# Patient Record
Sex: Female | Born: 1995 | Race: Black or African American | Hispanic: No | Marital: Single | Smoking: Never smoker
Health system: Southern US, Community
[De-identification: ages and names within clinical notes are randomized; demographics above are authoritative.]

## PROBLEM LIST (undated history)

## (undated) DIAGNOSIS — Z87898 Personal history of other specified conditions: Secondary | ICD-10-CM

## (undated) DIAGNOSIS — J45909 Unspecified asthma, uncomplicated: Secondary | ICD-10-CM

---
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
## 2018-05-30 DIAGNOSIS — Z0001 Encounter for general adult medical examination with abnormal findings: Secondary | ICD-10-CM | POA: Diagnosis not present

## 2018-05-30 DIAGNOSIS — H539 Unspecified visual disturbance: Secondary | ICD-10-CM | POA: Diagnosis not present

## 2018-05-30 DIAGNOSIS — Z6841 Body Mass Index (BMI) 40.0 and over, adult: Secondary | ICD-10-CM | POA: Diagnosis not present

## 2018-05-30 DIAGNOSIS — Z23 Encounter for immunization: Secondary | ICD-10-CM | POA: Diagnosis not present

## 2018-05-30 DIAGNOSIS — Z01419 Encounter for gynecological examination (general) (routine) without abnormal findings: Secondary | ICD-10-CM | POA: Diagnosis not present

## 2018-05-31 ENCOUNTER — Other Ambulatory Visit: Payer: Self-pay | Admitting: Internal Medicine

## 2018-05-31 DIAGNOSIS — E049 Nontoxic goiter, unspecified: Secondary | ICD-10-CM

## 2018-05-31 DIAGNOSIS — J302 Other seasonal allergic rhinitis: Secondary | ICD-10-CM | POA: Diagnosis not present

## 2018-06-06 ENCOUNTER — Ambulatory Visit
Admission: RE | Admit: 2018-06-06 | Discharge: 2018-06-06 | Disposition: A | Discharge status: Home / Self Care | Payer: BLUE CROSS/BLUE SHIELD | Source: Ambulatory Visit | Attending: Internal Medicine | Admitting: Internal Medicine

## 2018-06-06 DIAGNOSIS — E049 Nontoxic goiter, unspecified: Secondary | ICD-10-CM

## 2018-06-06 DIAGNOSIS — Z0001 Encounter for general adult medical examination with abnormal findings: Secondary | ICD-10-CM | POA: Diagnosis not present

## 2018-08-19 DIAGNOSIS — Z0001 Encounter for general adult medical examination with abnormal findings: Secondary | ICD-10-CM | POA: Diagnosis not present

## 2018-09-05 DIAGNOSIS — J302 Other seasonal allergic rhinitis: Secondary | ICD-10-CM | POA: Diagnosis not present

## 2018-09-05 DIAGNOSIS — Z23 Encounter for immunization: Secondary | ICD-10-CM | POA: Diagnosis not present

## 2019-08-09 DIAGNOSIS — Z111 Encounter for screening for respiratory tuberculosis: Secondary | ICD-10-CM | POA: Diagnosis not present

## 2019-08-09 DIAGNOSIS — Z23 Encounter for immunization: Secondary | ICD-10-CM | POA: Diagnosis not present

## 2020-01-10 ENCOUNTER — Ambulatory Visit: Payer: Self-pay | Attending: Internal Medicine

## 2020-01-10 DIAGNOSIS — Z20822 Contact with and (suspected) exposure to covid-19: Secondary | ICD-10-CM | POA: Insufficient documentation

## 2020-01-11 LAB — NOVEL CORONAVIRUS, NAA: SARS-CoV-2, NAA: NOT DETECTED

## 2020-05-27 IMAGING — US US THYROID
1 series · 14 of 25 positions shown · non-contrast
Comparison: None.

CLINICAL DATA: Goiter.

EXAM:
THYROID ULTRASOUND
TECHNIQUE: Ultrasound examination of the thyroid gland and adjacent soft
tissues was performed.

[Series 1: us thyroid · 0.05mm/px · 14 of 44 slices shown]
[im 1/44]
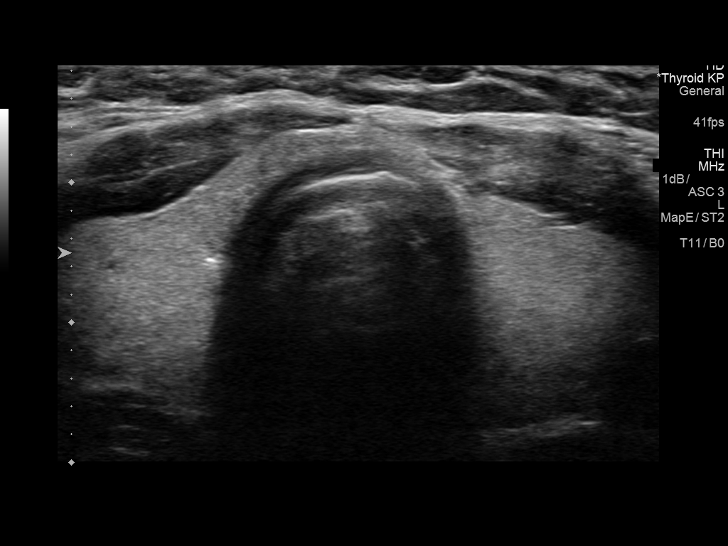
[im 4/44]
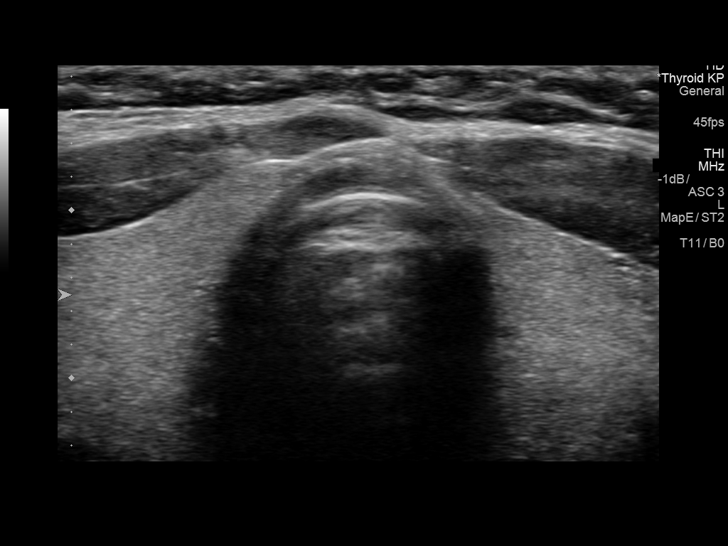
[im 8/44]
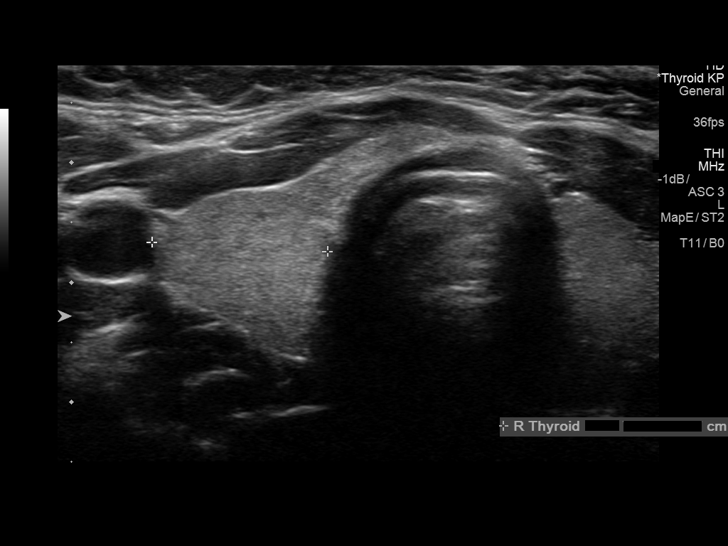
[im 11/44]
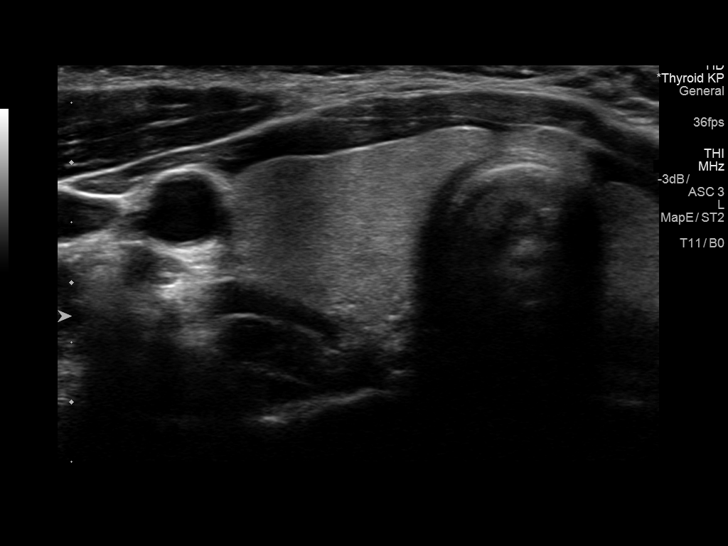
[im 15/44]
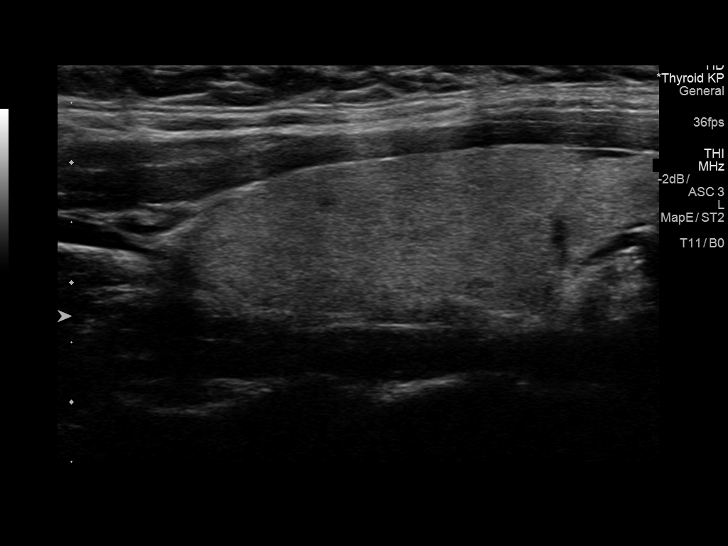
[im 17/44]
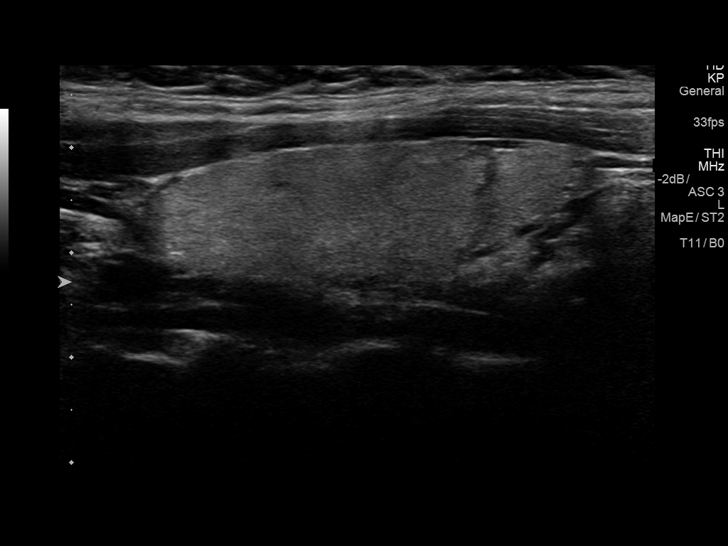
[im 20/44]
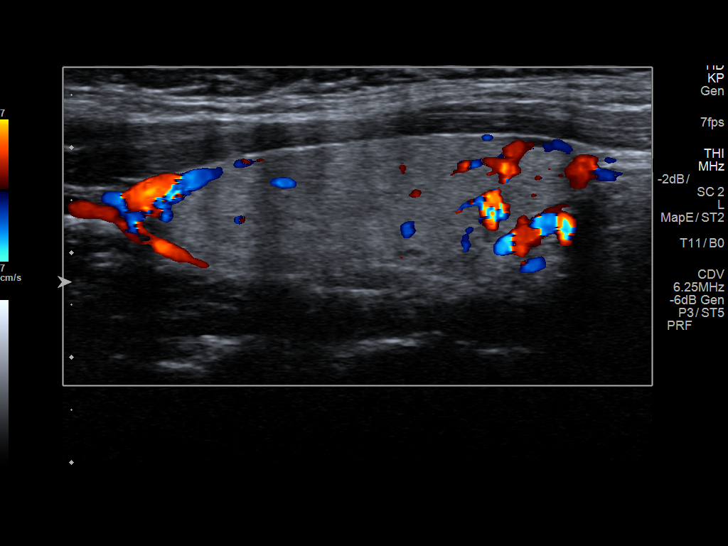
[im 24/44]
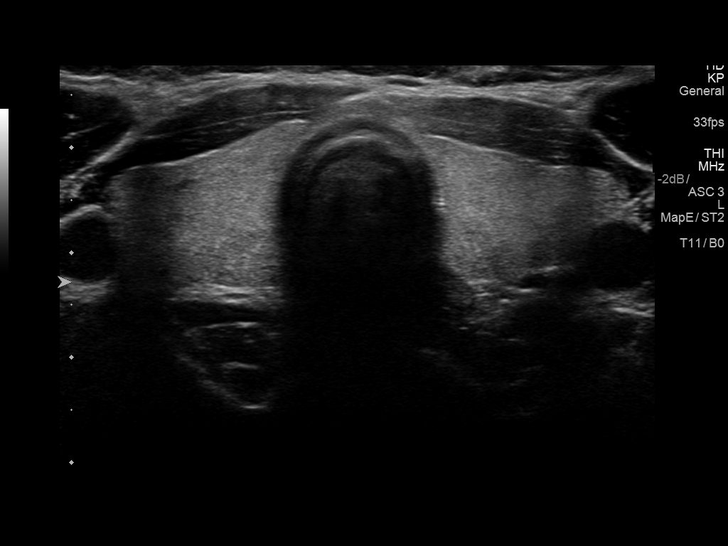
[im 27/44]
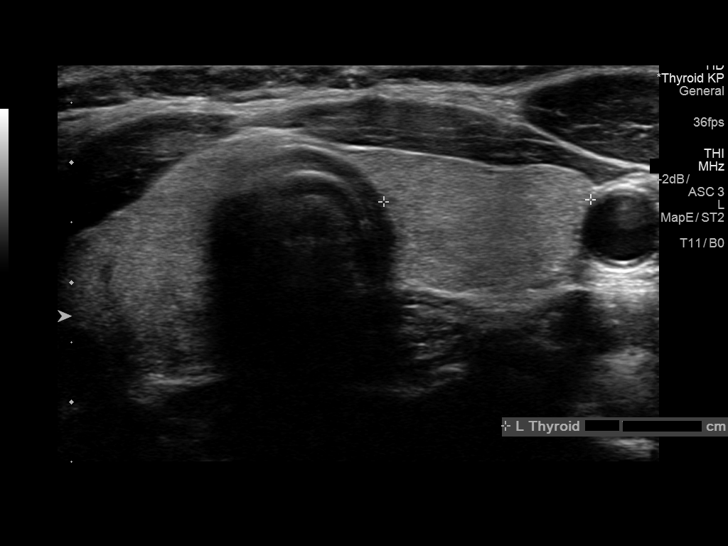
[im 29/44]
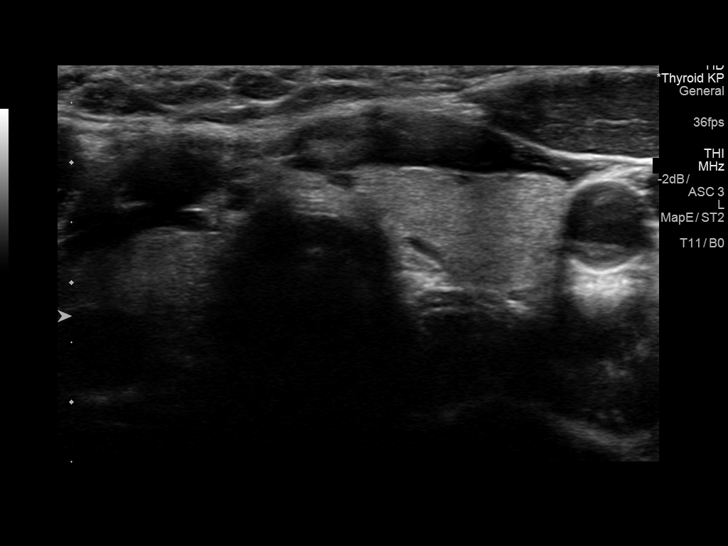
[im 33/44]
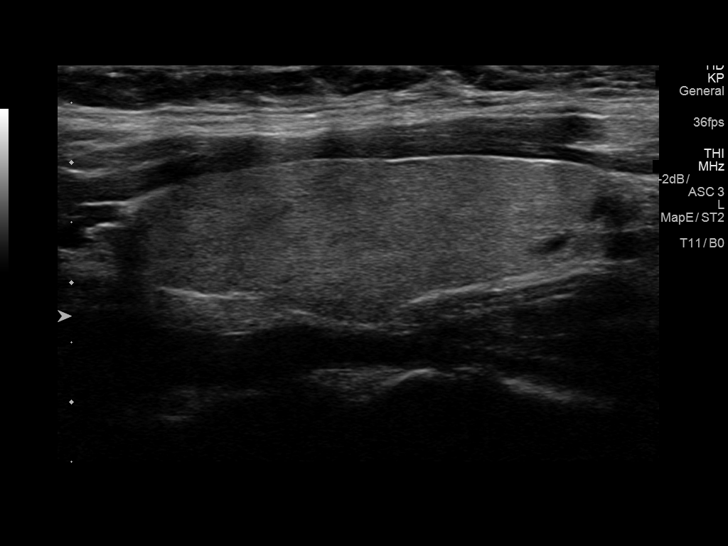
[im 36/44]
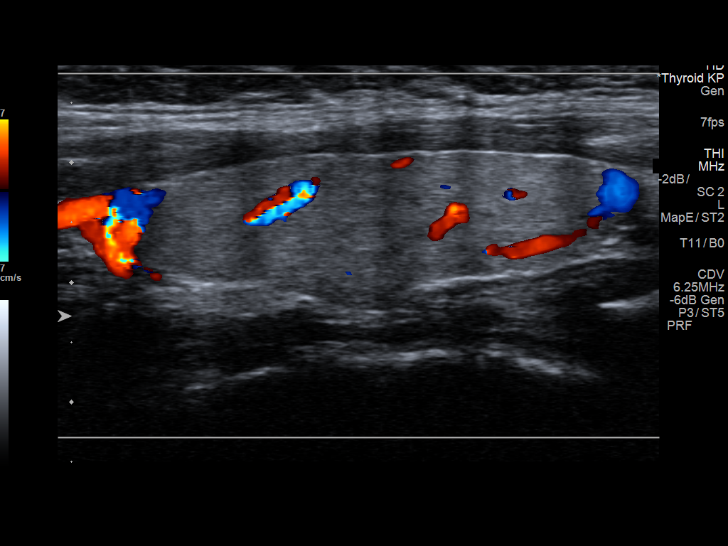
[im 40/44]
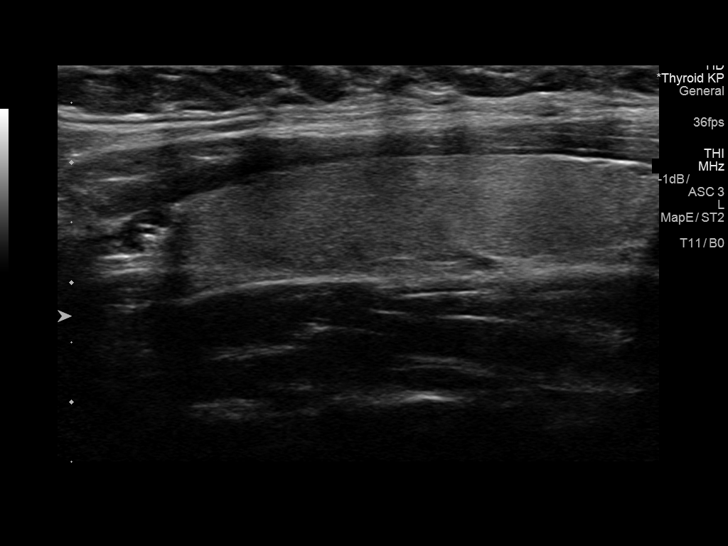
[im 44/44]
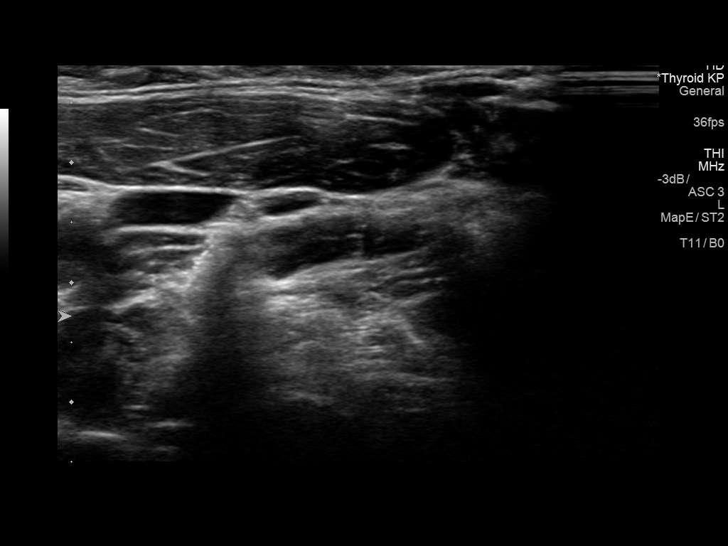

[14 of 25 positions shown; findings below may reference images not displayed]

FINDINGS: Parenchymal Echotexture: Normal

Isthmus: 0.3 cm

Right lobe: 4.4 x 1.4 x 1.5 cm

Left lobe: 4.2 x 1.3 x 1.7 cm

_________________________________________________________

Estimated total number of nodules >/= 1 cm: 0

Number of spongiform nodules >/=  2 cm not described below (TR1): 0

Number of mixed cystic and solid nodules >/= 1.5 cm not described
below (TR2): 0

_________________________________________________________

No discrete nodules are seen within the thyroid gland. No enlarged
lymph nodes.
IMPRESSION: Normal thyroid ultrasound.

## 2020-10-17 DIAGNOSIS — Z23 Encounter for immunization: Secondary | ICD-10-CM | POA: Diagnosis not present

## 2020-11-05 DIAGNOSIS — H10021 Other mucopurulent conjunctivitis, right eye: Secondary | ICD-10-CM | POA: Diagnosis not present

## 2021-02-05 DIAGNOSIS — Z20822 Contact with and (suspected) exposure to covid-19: Secondary | ICD-10-CM | POA: Diagnosis not present

## 2021-09-08 DIAGNOSIS — Z20822 Contact with and (suspected) exposure to covid-19: Secondary | ICD-10-CM | POA: Diagnosis not present

## 2021-09-10 ENCOUNTER — Encounter (HOSPITAL_COMMUNITY): Payer: Self-pay | Admitting: Emergency Medicine

## 2021-09-10 ENCOUNTER — Other Ambulatory Visit: Payer: Self-pay

## 2021-09-10 ENCOUNTER — Ambulatory Visit (HOSPITAL_COMMUNITY)
Admission: EM | Admit: 2021-09-10 | Discharge: 2021-09-10 | Disposition: A | Discharge status: Home / Self Care | Payer: BC Managed Care – PPO

## 2021-09-10 ENCOUNTER — Ambulatory Visit: Payer: Self-pay

## 2021-09-10 DIAGNOSIS — U071 COVID-19: Secondary | ICD-10-CM

## 2021-09-10 HISTORY — DX: Unspecified asthma, uncomplicated: J45.909

## 2021-09-10 HISTORY — DX: Personal history of other specified conditions: Z87.898

## 2021-09-10 MED ORDER — ALBUTEROL SULFATE HFA 108 (90 BASE) MCG/ACT IN AERS: 1.0000 | INHALATION_SPRAY | Freq: Four times a day (QID) | RESPIRATORY_TRACT | 0 refills | Status: AC | PRN

## 2021-09-10 MED ORDER — PROMETHAZINE-DM 6.25-15 MG/5ML PO SYRP: 5.0000 mL | ORAL_SOLUTION | Freq: Four times a day (QID) | ORAL | 0 refills | Status: AC | PRN

## 2021-09-10 NOTE — Discharge Instructions (Addendum)
You can take the promethazine DM as needed for cough.  Do not take this before driving as it can make you sleepy.    You can use the albuterol inhaler as needed for coughing, shortness of breath, and wheezing.   You can take Tylenol and/or Ibuprofen as needed for fever reduction and pain relief.   For cough: honey 1/2 to 1 teaspoon (you can dilute the honey in water or another fluid).  You can also use guaifenesin and dextromethorphan for cough. You can use a humidifier for chest congestion and cough.  If you don't have a humidifier, you can sit in the bathroom with the hot shower running.     For sore throat: try warm salt water gargles, cepacol lozenges, throat spray, warm tea or water with lemon/honey, popsicles or ice, or OTC cold relief medicine for throat discomfort.    For congestion: take a daily anti-histamine like Zyrtec, Claritin, and a oral decongestant, such as pseudoephedrine.  You can also use Flonase 1-2 sprays in each nostril daily.    It is important to stay hydrated: drink plenty of fluids (water, gatorade/powerade/pedialyte, juices, or teas) to keep your throat moisturized and help further relieve irritation/discomfort.   Return or go to the Emergency Department if symptoms worsen or do not improve in the next few days.  

## 2021-09-10 NOTE — ED Triage Notes (Addendum)
Patient c/o fatigue and SOB x 5 days.   Patient states she's had 2 POSITIVE COVID antigen test and 1 POSITIVE PCR TEST.   Patient endorses generalized body aches. Patient endorses nausea.  Patient endorses headache.   History of Pneumonia and Asthma.   Patient has taken OTC Dayquil and Nyquil with no relief of symptoms.

## 2021-09-10 NOTE — ED Provider Notes (Signed)
[facility]    CSN: [id number] Arrival date & time: 09/10/21  0847      History   Chief Complaint Chief Complaint  Patient presents with   Shortness of Breath   Fatigue    HPI Anna Mendez is a 25 y.o. female.   Patient here for evaluation of fatigue, shortness of breath, headaches, and body aches that has been ongoing for the past 5 days.  Patient reports having several at home COVID tests that were positive and a positive PCR earlier this week.  Reports symptoms started on Friday.  Reports taking OTC DayQuil and NyQuil with minimal symptom relief.  Denies any trauma, injury, or other precipitating event.  Denies any specific alleviating or aggravating factors.  Denies any fevers, chest pain, shortness of breath, N/V/D, numbness, tingling, weakness, abdominal pain, or headaches.    The history is provided by the patient.  Shortness of Breath Associated symptoms: cough and sore throat    Past Medical History:  Diagnosis Date   Asthma    History of prediabetes     There are no problems to display for this patient.   History reviewed. No pertinent surgical history.  OB History   No obstetric history on file.      Home Medications    Prior to Admission medications   Medication Sig Start Date End Date Taking? Authorizing Provider  albuterol (VENTOLIN HFA) 108 (90 Base) MCG/ACT inhaler Inhale 1-2 puffs into the lungs every 6 (six) hours as needed for wheezing or shortness of breath. 09/10/21  Yes Ivette Loyal, NP  Multiple Vitamins-Minerals (MULTI COMPLETE) CAPS Take by mouth.   Yes [provider]  promethazine-dextromethorphan (PROMETHAZINE-DM) 6.25-15 MG/5ML syrup Take 5 mLs by mouth 4 (four) times daily as needed for cough. 09/10/21  Yes Ivette Loyal, NP    Family History No family history on file.  Social History Social History   Tobacco Use   Smoking status: Never   Smokeless tobacco: Never  Vaping Use   Vaping Use: Never used   Substance Use Topics   Alcohol use: Not Currently   Drug use: Never     Allergies   Patient has no known allergies.   Review of Systems Review of Systems  Constitutional:  Positive for fatigue.  HENT:  Positive for congestion and sore throat. Negative for trouble swallowing.   Respiratory:  Positive for cough and shortness of breath.   Musculoskeletal:  Positive for myalgias.  All other systems reviewed and are negative.   Physical Exam Triage Vital Signs ED Triage Vitals  Enc Vitals Group     BP 09/10/21 0936 (!) 143/95     Pulse Rate 09/10/21 0936 69     Resp 09/10/21 0936 16     Temp 09/10/21 0936 98.9 F (37.2 C)     Temp Source 09/10/21 0936 Oral     SpO2 09/10/21 0936 98 %     Weight 09/10/21 0936 293 lb (132.9 kg)     Height 09/10/21 0936 5\' 5"  (1.651 m)     Head Circumference --      Peak Flow --      Pain Score 09/10/21 0932 4     Pain Loc --      Pain Edu? --      Excl. in GC? --    No data found.  Updated Vital Signs BP (!) 143/95 (BP Location: Right Arm)   Pulse 69   Temp 98.9 F (37.2  C) (Oral)   Resp 16   Ht 5\' 5"  (1.651 m)   Wt 293 lb (132.9 kg)   LMP 08/24/2021   SpO2 98%   BMI 48.76 kg/m   Visual Acuity Right Eye Distance:   Left Eye Distance:   Bilateral Distance:    Right Eye Near:   Left Eye Near:    Bilateral Near:     Physical Exam Vitals and nursing note reviewed.  Constitutional:      General: She is not in acute distress.    Appearance: Normal appearance. She is not ill-appearing, toxic-appearing or diaphoretic.  HENT:     Head: Normocephalic and atraumatic.     Right Ear: Tympanic membrane, ear canal and external ear normal.     Left Ear: Tympanic membrane, ear canal and external ear normal.     Nose: Congestion present.     Mouth/Throat:     Pharynx: Posterior oropharyngeal erythema present. No oropharyngeal exudate.  Eyes:     Conjunctiva/sclera: Conjunctivae normal.  Cardiovascular:     Rate and Rhythm:  Normal rate and regular rhythm.     Pulses: Normal pulses.     Heart sounds: Normal heart sounds.  Pulmonary:     Effort: Pulmonary effort is normal.     Breath sounds: Normal breath sounds. No decreased breath sounds, wheezing, rhonchi or rales.  Chest:     Chest wall: No mass, deformity, tenderness, crepitus or edema. There is no dullness to percussion.  Abdominal:     General: Abdomen is flat.     Palpations: Abdomen is soft.  Musculoskeletal:        General: Normal range of motion.     Cervical back: Normal range of motion.  Skin:    General: Skin is warm and dry.  Neurological:     General: No focal deficit present.     Mental Status: She is alert and oriented to person, place, and time.  Psychiatric:        Mood and Affect: Mood normal.     UC Treatments / Results  Labs (all labs ordered are listed, but only abnormal results are displayed) Labs Reviewed - No data to display  EKG   Radiology No results found.  Procedures Procedures (including critical care time)  Medications Ordered in UC Medications - No data to display  Initial Impression / Assessment and Plan / UC Course  I have reviewed the triage vital signs and the nursing notes.  Pertinent labs & imaging results that were available during my care of the patient were reviewed by me and considered in my medical decision making (see chart for details).    Assessment negative for red flags or concerns.  COVID-19.  May take Promethazine DM as needed for cough but instructed not to take it prior to driving as it can cause drowsiness.  May use albuterol inhaler as needed.  Tylenol and/or ibuprofen as needed.  Discussed conservative symptom management as described in discharge instructions.  Follow-up with primary care for reevaluation. Final Clinical Impressions(s) / UC Diagnoses   Final diagnoses:  COVID-19     Discharge Instructions      You can take the promethazine DM as needed for cough.  Do not  take this before driving as it can make you sleepy.    You can use the albuterol inhaler as needed for coughing, shortness of breath, and wheezing.   You can take Tylenol and/or Ibuprofen as needed for fever reduction and pain relief.  For cough: honey 1/2 to 1 teaspoon (you can dilute the honey in water or another fluid).  You can also use guaifenesin and dextromethorphan for cough. You can use a humidifier for chest congestion and cough.  If you don't have a humidifier, you can sit in the bathroom with the hot shower running.     For sore throat: try warm salt water gargles, cepacol lozenges, throat spray, warm tea or water with lemon/honey, popsicles or ice, or OTC cold relief medicine for throat discomfort.    For congestion: take a daily anti-histamine like Zyrtec, Claritin, and a oral decongestant, such as pseudoephedrine.  You can also use Flonase 1-2 sprays in each nostril daily.    It is important to stay hydrated: drink plenty of fluids (water, gatorade/powerade/pedialyte, juices, or teas) to keep your throat moisturized and help further relieve irritation/discomfort.   Return or go to the Emergency Department if symptoms worsen or do not improve in the next few days.      ED Prescriptions     Medication Sig Dispense Auth. Provider   albuterol (VENTOLIN HFA) 108 (90 Base) MCG/ACT inhaler Inhale 1-2 puffs into the lungs every 6 (six) hours as needed for wheezing or shortness of breath. 18 g Ivette Loyal, NP   promethazine-dextromethorphan (PROMETHAZINE-DM) 6.25-15 MG/5ML syrup Take 5 mLs by mouth 4 (four) times daily as needed for cough. 118 mL Ivette Loyal, NP      PDMP not reviewed this encounter.   Ivette Loyal, NP 09/10/21 1049

## 2021-09-20 DIAGNOSIS — F411 Generalized anxiety disorder: Secondary | ICD-10-CM | POA: Diagnosis not present

## 2021-10-16 DIAGNOSIS — F4310 Post-traumatic stress disorder, unspecified: Secondary | ICD-10-CM | POA: Diagnosis not present

## 2021-10-30 DIAGNOSIS — F4310 Post-traumatic stress disorder, unspecified: Secondary | ICD-10-CM | POA: Diagnosis not present

## 2021-11-18 DIAGNOSIS — F4310 Post-traumatic stress disorder, unspecified: Secondary | ICD-10-CM | POA: Diagnosis not present

## 2021-12-10 DIAGNOSIS — F4310 Post-traumatic stress disorder, unspecified: Secondary | ICD-10-CM | POA: Diagnosis not present

## 2021-12-25 DIAGNOSIS — F4310 Post-traumatic stress disorder, unspecified: Secondary | ICD-10-CM | POA: Diagnosis not present

## 2022-01-13 DIAGNOSIS — F4310 Post-traumatic stress disorder, unspecified: Secondary | ICD-10-CM | POA: Diagnosis not present

## 2022-01-29 DIAGNOSIS — F4310 Post-traumatic stress disorder, unspecified: Secondary | ICD-10-CM | POA: Diagnosis not present

## 2022-02-26 DIAGNOSIS — F4310 Post-traumatic stress disorder, unspecified: Secondary | ICD-10-CM | POA: Diagnosis not present

## 2022-03-12 DIAGNOSIS — F4310 Post-traumatic stress disorder, unspecified: Secondary | ICD-10-CM | POA: Diagnosis not present

## 2022-04-02 DIAGNOSIS — F4310 Post-traumatic stress disorder, unspecified: Secondary | ICD-10-CM | POA: Diagnosis not present

## 2022-04-21 DIAGNOSIS — F4310 Post-traumatic stress disorder, unspecified: Secondary | ICD-10-CM | POA: Diagnosis not present

## 2022-05-11 DIAGNOSIS — F4310 Post-traumatic stress disorder, unspecified: Secondary | ICD-10-CM | POA: Diagnosis not present

## 2022-05-28 DIAGNOSIS — F4310 Post-traumatic stress disorder, unspecified: Secondary | ICD-10-CM | POA: Diagnosis not present

## 2022-06-15 DIAGNOSIS — F4310 Post-traumatic stress disorder, unspecified: Secondary | ICD-10-CM | POA: Diagnosis not present

## 2022-06-29 DIAGNOSIS — F4310 Post-traumatic stress disorder, unspecified: Secondary | ICD-10-CM | POA: Diagnosis not present

## 2022-07-20 DIAGNOSIS — F4310 Post-traumatic stress disorder, unspecified: Secondary | ICD-10-CM | POA: Diagnosis not present

## 2022-08-10 DIAGNOSIS — F4310 Post-traumatic stress disorder, unspecified: Secondary | ICD-10-CM | POA: Diagnosis not present

## 2022-09-08 DIAGNOSIS — Z113 Encounter for screening for infections with a predominantly sexual mode of transmission: Secondary | ICD-10-CM | POA: Diagnosis not present

## 2022-09-08 DIAGNOSIS — Z6841 Body Mass Index (BMI) 40.0 and over, adult: Secondary | ICD-10-CM | POA: Diagnosis not present

## 2022-09-08 DIAGNOSIS — Z1151 Encounter for screening for human papillomavirus (HPV): Secondary | ICD-10-CM | POA: Diagnosis not present

## 2022-09-08 DIAGNOSIS — F4310 Post-traumatic stress disorder, unspecified: Secondary | ICD-10-CM | POA: Diagnosis not present

## 2022-09-08 DIAGNOSIS — Z124 Encounter for screening for malignant neoplasm of cervix: Secondary | ICD-10-CM | POA: Diagnosis not present

## 2022-09-08 DIAGNOSIS — Z01419 Encounter for gynecological examination (general) (routine) without abnormal findings: Secondary | ICD-10-CM | POA: Diagnosis not present

## 2022-09-15 DIAGNOSIS — F4310 Post-traumatic stress disorder, unspecified: Secondary | ICD-10-CM | POA: Diagnosis not present

## 2022-09-25 DIAGNOSIS — F4310 Post-traumatic stress disorder, unspecified: Secondary | ICD-10-CM | POA: Diagnosis not present

## 2022-10-15 DIAGNOSIS — Z131 Encounter for screening for diabetes mellitus: Secondary | ICD-10-CM | POA: Diagnosis not present

## 2022-10-15 DIAGNOSIS — Z13220 Encounter for screening for lipoid disorders: Secondary | ICD-10-CM | POA: Diagnosis not present

## 2022-10-15 DIAGNOSIS — Z0000 Encounter for general adult medical examination without abnormal findings: Secondary | ICD-10-CM | POA: Diagnosis not present

## 2022-10-15 DIAGNOSIS — Z833 Family history of diabetes mellitus: Secondary | ICD-10-CM | POA: Diagnosis not present

## 2022-10-15 DIAGNOSIS — Z79899 Other long term (current) drug therapy: Secondary | ICD-10-CM | POA: Diagnosis not present

## 2022-10-16 DIAGNOSIS — F4310 Post-traumatic stress disorder, unspecified: Secondary | ICD-10-CM | POA: Diagnosis not present

## 2022-11-03 DIAGNOSIS — F4310 Post-traumatic stress disorder, unspecified: Secondary | ICD-10-CM | POA: Diagnosis not present

## 2022-11-26 DIAGNOSIS — F439 Reaction to severe stress, unspecified: Secondary | ICD-10-CM | POA: Diagnosis not present

## 2022-12-28 DIAGNOSIS — F439 Reaction to severe stress, unspecified: Secondary | ICD-10-CM | POA: Diagnosis not present

## 2023-01-12 DIAGNOSIS — F439 Reaction to severe stress, unspecified: Secondary | ICD-10-CM | POA: Diagnosis not present

## 2023-01-22 DIAGNOSIS — F439 Reaction to severe stress, unspecified: Secondary | ICD-10-CM | POA: Diagnosis not present

## 2023-02-19 DIAGNOSIS — F439 Reaction to severe stress, unspecified: Secondary | ICD-10-CM | POA: Diagnosis not present

## 2023-03-23 DIAGNOSIS — F439 Reaction to severe stress, unspecified: Secondary | ICD-10-CM | POA: Diagnosis not present

## 2023-04-19 DIAGNOSIS — F439 Reaction to severe stress, unspecified: Secondary | ICD-10-CM | POA: Diagnosis not present

## 2023-05-04 DIAGNOSIS — F439 Reaction to severe stress, unspecified: Secondary | ICD-10-CM | POA: Diagnosis not present

## 2023-05-19 DIAGNOSIS — F439 Reaction to severe stress, unspecified: Secondary | ICD-10-CM | POA: Diagnosis not present

## 2023-06-04 DIAGNOSIS — F439 Reaction to severe stress, unspecified: Secondary | ICD-10-CM | POA: Diagnosis not present

## 2023-07-09 DIAGNOSIS — F439 Reaction to severe stress, unspecified: Secondary | ICD-10-CM | POA: Diagnosis not present

## 2023-07-23 DIAGNOSIS — F439 Reaction to severe stress, unspecified: Secondary | ICD-10-CM | POA: Diagnosis not present

## 2023-08-27 DIAGNOSIS — F439 Reaction to severe stress, unspecified: Secondary | ICD-10-CM | POA: Diagnosis not present

## 2023-09-13 DIAGNOSIS — F439 Reaction to severe stress, unspecified: Secondary | ICD-10-CM | POA: Diagnosis not present

## 2023-10-06 DIAGNOSIS — F439 Reaction to severe stress, unspecified: Secondary | ICD-10-CM | POA: Diagnosis not present

## 2023-11-05 DIAGNOSIS — F439 Reaction to severe stress, unspecified: Secondary | ICD-10-CM | POA: Diagnosis not present

## 2023-11-25 DIAGNOSIS — F439 Reaction to severe stress, unspecified: Secondary | ICD-10-CM | POA: Diagnosis not present

## 2023-12-21 DIAGNOSIS — R7303 Prediabetes: Secondary | ICD-10-CM | POA: Diagnosis not present

## 2024-01-01 DIAGNOSIS — F439 Reaction to severe stress, unspecified: Secondary | ICD-10-CM | POA: Diagnosis not present

## 2024-01-03 DIAGNOSIS — E559 Vitamin D deficiency, unspecified: Secondary | ICD-10-CM | POA: Diagnosis not present

## 2024-01-03 DIAGNOSIS — E88819 Insulin resistance, unspecified: Secondary | ICD-10-CM | POA: Diagnosis not present

## 2024-01-03 DIAGNOSIS — R632 Polyphagia: Secondary | ICD-10-CM | POA: Diagnosis not present

## 2024-01-03 DIAGNOSIS — Z6841 Body Mass Index (BMI) 40.0 and over, adult: Secondary | ICD-10-CM | POA: Diagnosis not present

## 2024-01-03 DIAGNOSIS — E66813 Obesity, class 3: Secondary | ICD-10-CM | POA: Diagnosis not present

## 2024-01-03 DIAGNOSIS — F419 Anxiety disorder, unspecified: Secondary | ICD-10-CM | POA: Diagnosis not present

## 2024-01-21 DIAGNOSIS — R6883 Chills (without fever): Secondary | ICD-10-CM | POA: Diagnosis not present

## 2024-01-21 DIAGNOSIS — R509 Fever, unspecified: Secondary | ICD-10-CM | POA: Diagnosis not present

## 2024-01-21 DIAGNOSIS — Z20828 Contact with and (suspected) exposure to other viral communicable diseases: Secondary | ICD-10-CM | POA: Diagnosis not present

## 2024-01-21 DIAGNOSIS — J029 Acute pharyngitis, unspecified: Secondary | ICD-10-CM | POA: Diagnosis not present

## 2024-01-21 DIAGNOSIS — J069 Acute upper respiratory infection, unspecified: Secondary | ICD-10-CM | POA: Diagnosis not present

## 2024-02-11 DIAGNOSIS — J019 Acute sinusitis, unspecified: Secondary | ICD-10-CM | POA: Diagnosis not present

## 2024-02-16 DIAGNOSIS — F439 Reaction to severe stress, unspecified: Secondary | ICD-10-CM | POA: Diagnosis not present

## 2024-02-24 DIAGNOSIS — R632 Polyphagia: Secondary | ICD-10-CM | POA: Diagnosis not present

## 2024-02-24 DIAGNOSIS — F419 Anxiety disorder, unspecified: Secondary | ICD-10-CM | POA: Diagnosis not present

## 2024-02-24 DIAGNOSIS — Z6841 Body Mass Index (BMI) 40.0 and over, adult: Secondary | ICD-10-CM | POA: Diagnosis not present

## 2024-02-24 DIAGNOSIS — E66813 Obesity, class 3: Secondary | ICD-10-CM | POA: Diagnosis not present

## 2024-02-24 DIAGNOSIS — E88819 Insulin resistance, unspecified: Secondary | ICD-10-CM | POA: Diagnosis not present

## 2024-02-24 DIAGNOSIS — Z9189 Other specified personal risk factors, not elsewhere classified: Secondary | ICD-10-CM | POA: Diagnosis not present

## 2024-02-24 DIAGNOSIS — E559 Vitamin D deficiency, unspecified: Secondary | ICD-10-CM | POA: Diagnosis not present

## 2024-03-07 DIAGNOSIS — F439 Reaction to severe stress, unspecified: Secondary | ICD-10-CM | POA: Diagnosis not present

## 2024-03-16 DIAGNOSIS — F419 Anxiety disorder, unspecified: Secondary | ICD-10-CM | POA: Diagnosis not present

## 2024-03-16 DIAGNOSIS — Z6841 Body Mass Index (BMI) 40.0 and over, adult: Secondary | ICD-10-CM | POA: Diagnosis not present

## 2024-03-16 DIAGNOSIS — R632 Polyphagia: Secondary | ICD-10-CM | POA: Diagnosis not present

## 2024-03-16 DIAGNOSIS — E559 Vitamin D deficiency, unspecified: Secondary | ICD-10-CM | POA: Diagnosis not present

## 2024-03-16 DIAGNOSIS — E66813 Obesity, class 3: Secondary | ICD-10-CM | POA: Diagnosis not present

## 2024-03-16 DIAGNOSIS — E88819 Insulin resistance, unspecified: Secondary | ICD-10-CM | POA: Diagnosis not present

## 2024-03-16 DIAGNOSIS — Z9189 Other specified personal risk factors, not elsewhere classified: Secondary | ICD-10-CM | POA: Diagnosis not present

## 2024-03-17 DIAGNOSIS — F439 Reaction to severe stress, unspecified: Secondary | ICD-10-CM | POA: Diagnosis not present

## 2024-04-07 DIAGNOSIS — F439 Reaction to severe stress, unspecified: Secondary | ICD-10-CM | POA: Diagnosis not present

## 2024-04-26 DIAGNOSIS — F439 Reaction to severe stress, unspecified: Secondary | ICD-10-CM | POA: Diagnosis not present

## 2024-06-19 ENCOUNTER — Other Ambulatory Visit (HOSPITAL_BASED_OUTPATIENT_CLINIC_OR_DEPARTMENT_OTHER): Payer: Self-pay

## 2024-06-19 DIAGNOSIS — Z6841 Body Mass Index (BMI) 40.0 and over, adult: Secondary | ICD-10-CM | POA: Diagnosis not present

## 2024-06-19 DIAGNOSIS — E66813 Obesity, class 3: Secondary | ICD-10-CM | POA: Diagnosis not present

## 2024-06-19 DIAGNOSIS — Z9189 Other specified personal risk factors, not elsewhere classified: Secondary | ICD-10-CM | POA: Diagnosis not present

## 2024-06-19 DIAGNOSIS — R632 Polyphagia: Secondary | ICD-10-CM | POA: Diagnosis not present

## 2024-06-19 DIAGNOSIS — F419 Anxiety disorder, unspecified: Secondary | ICD-10-CM | POA: Diagnosis not present

## 2024-06-19 DIAGNOSIS — E559 Vitamin D deficiency, unspecified: Secondary | ICD-10-CM | POA: Diagnosis not present

## 2024-06-19 DIAGNOSIS — E88819 Insulin resistance, unspecified: Secondary | ICD-10-CM | POA: Diagnosis not present

## 2024-06-19 MED ORDER — METFORMIN HCL ER 500 MG PO TB24
500.0000 mg | ORAL_TABLET | Freq: Two times a day (BID) | ORAL | 0 refills | Status: AC
  Filled 2024-06-19: qty 180, 90d supply, fill #0

## 2024-06-19 MED ORDER — LOMAIRA 8 MG PO TABS
8.0000 mg | ORAL_TABLET | Freq: Two times a day (BID) | ORAL | 0 refills | Status: AC
  Filled 2024-06-19: qty 60, 30d supply, fill #0

## 2024-06-28 ENCOUNTER — Other Ambulatory Visit (HOSPITAL_BASED_OUTPATIENT_CLINIC_OR_DEPARTMENT_OTHER): Payer: Self-pay

## 2024-06-30 ENCOUNTER — Other Ambulatory Visit (HOSPITAL_BASED_OUTPATIENT_CLINIC_OR_DEPARTMENT_OTHER): Payer: Self-pay

## 2024-07-24 DIAGNOSIS — Z6841 Body Mass Index (BMI) 40.0 and over, adult: Secondary | ICD-10-CM | POA: Diagnosis not present

## 2024-07-24 DIAGNOSIS — F419 Anxiety disorder, unspecified: Secondary | ICD-10-CM | POA: Diagnosis not present

## 2024-07-24 DIAGNOSIS — Z9189 Other specified personal risk factors, not elsewhere classified: Secondary | ICD-10-CM | POA: Diagnosis not present

## 2024-07-24 DIAGNOSIS — E559 Vitamin D deficiency, unspecified: Secondary | ICD-10-CM | POA: Diagnosis not present

## 2024-07-24 DIAGNOSIS — E66813 Obesity, class 3: Secondary | ICD-10-CM | POA: Diagnosis not present

## 2024-07-24 DIAGNOSIS — R632 Polyphagia: Secondary | ICD-10-CM | POA: Diagnosis not present

## 2024-07-24 DIAGNOSIS — E88819 Insulin resistance, unspecified: Secondary | ICD-10-CM | POA: Diagnosis not present

## 2024-07-25 ENCOUNTER — Other Ambulatory Visit (HOSPITAL_BASED_OUTPATIENT_CLINIC_OR_DEPARTMENT_OTHER): Payer: Self-pay

## 2024-07-25 MED ORDER — PHENTERMINE-TOPIRAMATE ER 3.75-23 MG PO CP24
2.0000 | ORAL_CAPSULE | Freq: Two times a day (BID) | ORAL | 0 refills | Status: AC
  Filled 2024-07-25: qty 120, 30d supply, fill #0

## 2024-07-28 ENCOUNTER — Other Ambulatory Visit (HOSPITAL_BASED_OUTPATIENT_CLINIC_OR_DEPARTMENT_OTHER): Payer: Self-pay

## 2024-08-01 ENCOUNTER — Other Ambulatory Visit (HOSPITAL_BASED_OUTPATIENT_CLINIC_OR_DEPARTMENT_OTHER): Payer: Self-pay

## 2024-08-07 ENCOUNTER — Other Ambulatory Visit (HOSPITAL_BASED_OUTPATIENT_CLINIC_OR_DEPARTMENT_OTHER): Payer: Self-pay

## 2024-08-08 DIAGNOSIS — H6123 Impacted cerumen, bilateral: Secondary | ICD-10-CM | POA: Diagnosis not present

## 2024-08-08 DIAGNOSIS — Z13220 Encounter for screening for lipoid disorders: Secondary | ICD-10-CM | POA: Diagnosis not present

## 2024-08-08 DIAGNOSIS — Z0000 Encounter for general adult medical examination without abnormal findings: Secondary | ICD-10-CM | POA: Diagnosis not present

## 2024-08-08 DIAGNOSIS — Z6841 Body Mass Index (BMI) 40.0 and over, adult: Secondary | ICD-10-CM | POA: Diagnosis not present

## 2024-08-08 DIAGNOSIS — Z130 Encounter for screening for diseases of the blood and blood-forming organs and certain disorders involving the immune mechanism: Secondary | ICD-10-CM | POA: Diagnosis not present

## 2024-08-08 DIAGNOSIS — Z820 Family history of epilepsy and other diseases of the nervous system: Secondary | ICD-10-CM | POA: Diagnosis not present

## 2024-08-08 DIAGNOSIS — E559 Vitamin D deficiency, unspecified: Secondary | ICD-10-CM | POA: Diagnosis not present

## 2024-08-08 DIAGNOSIS — J4520 Mild intermittent asthma, uncomplicated: Secondary | ICD-10-CM | POA: Diagnosis not present

## 2024-08-08 DIAGNOSIS — E88819 Insulin resistance, unspecified: Secondary | ICD-10-CM | POA: Diagnosis not present

## 2024-08-09 ENCOUNTER — Other Ambulatory Visit (HOSPITAL_BASED_OUTPATIENT_CLINIC_OR_DEPARTMENT_OTHER): Payer: Self-pay

## 2024-08-11 ENCOUNTER — Other Ambulatory Visit (HOSPITAL_BASED_OUTPATIENT_CLINIC_OR_DEPARTMENT_OTHER): Payer: Self-pay

## 2024-08-17 ENCOUNTER — Other Ambulatory Visit (HOSPITAL_BASED_OUTPATIENT_CLINIC_OR_DEPARTMENT_OTHER): Payer: Self-pay

## 2024-08-22 DIAGNOSIS — F419 Anxiety disorder, unspecified: Secondary | ICD-10-CM | POA: Diagnosis not present

## 2024-08-22 DIAGNOSIS — R632 Polyphagia: Secondary | ICD-10-CM | POA: Diagnosis not present

## 2024-08-22 DIAGNOSIS — E66813 Obesity, class 3: Secondary | ICD-10-CM | POA: Diagnosis not present

## 2024-08-22 DIAGNOSIS — Z9189 Other specified personal risk factors, not elsewhere classified: Secondary | ICD-10-CM | POA: Diagnosis not present

## 2024-08-22 DIAGNOSIS — Z6841 Body Mass Index (BMI) 40.0 and over, adult: Secondary | ICD-10-CM | POA: Diagnosis not present

## 2024-08-22 DIAGNOSIS — E559 Vitamin D deficiency, unspecified: Secondary | ICD-10-CM | POA: Diagnosis not present

## 2024-08-22 DIAGNOSIS — E88819 Insulin resistance, unspecified: Secondary | ICD-10-CM | POA: Diagnosis not present

## 2024-08-25 DIAGNOSIS — Z6841 Body Mass Index (BMI) 40.0 and over, adult: Secondary | ICD-10-CM | POA: Diagnosis not present

## 2024-08-25 DIAGNOSIS — Z809 Family history of malignant neoplasm, unspecified: Secondary | ICD-10-CM | POA: Diagnosis not present

## 2024-08-25 DIAGNOSIS — Z01419 Encounter for gynecological examination (general) (routine) without abnormal findings: Secondary | ICD-10-CM | POA: Diagnosis not present

## 2024-08-30 ENCOUNTER — Other Ambulatory Visit (HOSPITAL_BASED_OUTPATIENT_CLINIC_OR_DEPARTMENT_OTHER): Payer: Self-pay

## 2024-09-02 ENCOUNTER — Other Ambulatory Visit (HOSPITAL_BASED_OUTPATIENT_CLINIC_OR_DEPARTMENT_OTHER): Payer: Self-pay

## 2024-09-11 DIAGNOSIS — Z6841 Body Mass Index (BMI) 40.0 and over, adult: Secondary | ICD-10-CM | POA: Diagnosis not present

## 2024-09-11 DIAGNOSIS — G4730 Sleep apnea, unspecified: Secondary | ICD-10-CM | POA: Diagnosis not present

## 2024-09-11 DIAGNOSIS — E669 Obesity, unspecified: Secondary | ICD-10-CM | POA: Diagnosis not present

## 2024-09-14 ENCOUNTER — Other Ambulatory Visit (HOSPITAL_BASED_OUTPATIENT_CLINIC_OR_DEPARTMENT_OTHER): Payer: Self-pay

## 2024-09-20 ENCOUNTER — Other Ambulatory Visit (HOSPITAL_BASED_OUTPATIENT_CLINIC_OR_DEPARTMENT_OTHER): Payer: Self-pay

## 2024-09-28 DIAGNOSIS — E66813 Obesity, class 3: Secondary | ICD-10-CM | POA: Diagnosis not present

## 2024-09-28 DIAGNOSIS — E559 Vitamin D deficiency, unspecified: Secondary | ICD-10-CM | POA: Diagnosis not present

## 2024-09-28 DIAGNOSIS — Z6841 Body Mass Index (BMI) 40.0 and over, adult: Secondary | ICD-10-CM | POA: Diagnosis not present

## 2024-09-28 DIAGNOSIS — E88819 Insulin resistance, unspecified: Secondary | ICD-10-CM | POA: Diagnosis not present

## 2024-09-28 DIAGNOSIS — F419 Anxiety disorder, unspecified: Secondary | ICD-10-CM | POA: Diagnosis not present

## 2024-09-28 DIAGNOSIS — R632 Polyphagia: Secondary | ICD-10-CM | POA: Diagnosis not present

## 2024-09-28 DIAGNOSIS — Z9189 Other specified personal risk factors, not elsewhere classified: Secondary | ICD-10-CM | POA: Diagnosis not present

## 2024-10-10 DIAGNOSIS — H93293 Other abnormal auditory perceptions, bilateral: Secondary | ICD-10-CM | POA: Diagnosis not present

## 2024-10-12 ENCOUNTER — Other Ambulatory Visit (HOSPITAL_BASED_OUTPATIENT_CLINIC_OR_DEPARTMENT_OTHER): Payer: Self-pay

## 2024-11-09 DIAGNOSIS — E559 Vitamin D deficiency, unspecified: Secondary | ICD-10-CM | POA: Diagnosis not present

## 2024-11-09 DIAGNOSIS — E66813 Obesity, class 3: Secondary | ICD-10-CM | POA: Diagnosis not present

## 2024-11-09 DIAGNOSIS — E88819 Insulin resistance, unspecified: Secondary | ICD-10-CM | POA: Diagnosis not present

## 2024-11-09 DIAGNOSIS — R632 Polyphagia: Secondary | ICD-10-CM | POA: Diagnosis not present

## 2024-11-09 DIAGNOSIS — Z9189 Other specified personal risk factors, not elsewhere classified: Secondary | ICD-10-CM | POA: Diagnosis not present

## 2024-11-09 DIAGNOSIS — F419 Anxiety disorder, unspecified: Secondary | ICD-10-CM | POA: Diagnosis not present

## 2024-11-09 DIAGNOSIS — Z6841 Body Mass Index (BMI) 40.0 and over, adult: Secondary | ICD-10-CM | POA: Diagnosis not present

## 2024-11-20 DIAGNOSIS — F439 Reaction to severe stress, unspecified: Secondary | ICD-10-CM | POA: Diagnosis not present

## 2024-11-28 DIAGNOSIS — H01005 Unspecified blepharitis left lower eyelid: Secondary | ICD-10-CM | POA: Diagnosis not present

## 2024-11-28 DIAGNOSIS — H04222 Epiphora due to insufficient drainage, left lacrimal gland: Secondary | ICD-10-CM | POA: Diagnosis not present

## 2024-11-28 DIAGNOSIS — H1045 Other chronic allergic conjunctivitis: Secondary | ICD-10-CM | POA: Diagnosis not present
